# Patient Record
Sex: Female | Born: 1960 | Race: White | Hispanic: No | Marital: Married | Smoking: Never smoker
Health system: Southern US, Community
[De-identification: ages and names within clinical notes are randomized; demographics above are authoritative.]

## PROBLEM LIST (undated history)

## (undated) DIAGNOSIS — E785 Hyperlipidemia, unspecified: Secondary | ICD-10-CM

## (undated) DIAGNOSIS — B009 Herpesviral infection, unspecified: Secondary | ICD-10-CM

## (undated) HISTORY — PX: RHINOPLASTY: SUR1284

## (undated) HISTORY — PX: BREAST BIOPSY: SHX20

## (undated) HISTORY — DX: Hyperlipidemia, unspecified: E78.5

## (undated) HISTORY — DX: Herpesviral infection, unspecified: B00.9

---

## 1997-10-06 ENCOUNTER — Other Ambulatory Visit: Admission: RE | Admit: 1997-10-06 | Discharge: 1997-10-06 | Payer: Self-pay | Admitting: *Deleted

## 1998-10-31 ENCOUNTER — Other Ambulatory Visit: Admission: RE | Admit: 1998-10-31 | Discharge: 1998-10-31 | Payer: Self-pay | Admitting: *Deleted

## 1999-11-02 ENCOUNTER — Other Ambulatory Visit: Admission: RE | Admit: 1999-11-02 | Discharge: 1999-11-02 | Payer: Self-pay | Admitting: *Deleted

## 2000-10-01 ENCOUNTER — Other Ambulatory Visit: Admission: RE | Admit: 2000-10-01 | Discharge: 2000-10-01 | Payer: Self-pay | Admitting: Obstetrics and Gynecology

## 2000-12-15 ENCOUNTER — Inpatient Hospital Stay (HOSPITAL_COMMUNITY): Admission: AD | Admit: 2000-12-15 | Discharge: 2000-12-15 | Payer: Self-pay | Admitting: Obstetrics and Gynecology

## 2000-12-16 ENCOUNTER — Ambulatory Visit: Admission: AD | Admit: 2000-12-16 | Discharge: 2000-12-16 | Payer: Self-pay | Admitting: Obstetrics and Gynecology

## 2000-12-16 ENCOUNTER — Encounter (INDEPENDENT_AMBULATORY_CARE_PROVIDER_SITE_OTHER): Payer: Self-pay | Admitting: Specialist

## 2001-12-10 ENCOUNTER — Other Ambulatory Visit: Admission: RE | Admit: 2001-12-10 | Discharge: 2001-12-10 | Payer: Self-pay | Admitting: *Deleted

## 2001-12-31 ENCOUNTER — Emergency Department (HOSPITAL_COMMUNITY): Admission: EM | Admit: 2001-12-31 | Discharge: 2001-12-31 | Payer: Self-pay | Admitting: Emergency Medicine

## 2002-12-14 ENCOUNTER — Other Ambulatory Visit: Admission: RE | Admit: 2002-12-14 | Discharge: 2002-12-14 | Payer: Self-pay | Admitting: *Deleted

## 2003-01-20 ENCOUNTER — Encounter: Payer: Self-pay | Admitting: *Deleted

## 2003-01-20 ENCOUNTER — Encounter: Admission: RE | Admit: 2003-01-20 | Discharge: 2003-01-20 | Payer: Self-pay | Admitting: *Deleted

## 2003-01-27 ENCOUNTER — Encounter: Admission: RE | Admit: 2003-01-27 | Discharge: 2003-01-27 | Payer: Self-pay | Admitting: *Deleted

## 2003-11-29 ENCOUNTER — Other Ambulatory Visit: Admission: RE | Admit: 2003-11-29 | Discharge: 2003-11-29 | Payer: Self-pay | Admitting: *Deleted

## 2004-02-25 ENCOUNTER — Encounter: Admission: RE | Admit: 2004-02-25 | Discharge: 2004-02-25 | Payer: Self-pay | Admitting: *Deleted

## 2004-11-07 ENCOUNTER — Other Ambulatory Visit: Admission: RE | Admit: 2004-11-07 | Discharge: 2004-11-07 | Payer: Self-pay | Admitting: *Deleted

## 2005-04-03 ENCOUNTER — Encounter: Admission: RE | Admit: 2005-04-03 | Discharge: 2005-04-03 | Payer: Self-pay | Admitting: *Deleted

## 2005-11-08 ENCOUNTER — Other Ambulatory Visit: Admission: RE | Admit: 2005-11-08 | Discharge: 2005-11-08 | Payer: Self-pay | Admitting: *Deleted

## 2006-04-04 ENCOUNTER — Encounter: Admission: RE | Admit: 2006-04-04 | Discharge: 2006-04-04 | Payer: Self-pay | Admitting: *Deleted

## 2006-11-12 ENCOUNTER — Other Ambulatory Visit: Admission: RE | Admit: 2006-11-12 | Discharge: 2006-11-12 | Payer: Self-pay | Admitting: *Deleted

## 2007-04-15 ENCOUNTER — Encounter: Admission: RE | Admit: 2007-04-15 | Discharge: 2007-04-15 | Payer: Self-pay | Admitting: *Deleted

## 2007-11-13 ENCOUNTER — Other Ambulatory Visit: Admission: RE | Admit: 2007-11-13 | Discharge: 2007-11-13 | Payer: Self-pay | Admitting: Gynecology

## 2008-04-15 ENCOUNTER — Encounter: Admission: RE | Admit: 2008-04-15 | Discharge: 2008-04-15 | Payer: Self-pay | Admitting: Gynecology

## 2009-04-25 ENCOUNTER — Encounter: Admission: RE | Admit: 2009-04-25 | Discharge: 2009-04-25 | Payer: Self-pay | Admitting: Gynecology

## 2010-04-22 ENCOUNTER — Encounter: Payer: Self-pay | Admitting: *Deleted

## 2010-04-26 ENCOUNTER — Encounter
Admission: RE | Admit: 2010-04-26 | Discharge: 2010-04-26 | Payer: Self-pay | Source: Home / Self Care | Attending: Gynecology | Admitting: Gynecology

## 2010-08-18 NOTE — Op Note (Signed)
[facility]  Patient:    FARRAN, AMSDEN Visit Number: [id number] MRN: [id number]          Service Type: OBS Location: [facility] Attending Physician:  Osborn Coho Dictated by:   Janeece Riggers Dareen Piano, M.D. Proc. Date: 12/16/00 Admit Date:  12/15/2000                             Operative Report  PREOPERATIVE DIAGNOSES:       1. Intrauterine pregnancy at 18 weeks estimated                                  gestational age.                               2. Spina bifida.  POSTOPERATIVE DIAGNOSES:      1. Intrauterine pregnancy at 18 weeks estimated                                  gestational age.                               2. Spina bifida.  OPERATION:                    Dilation and evacuation.  SURGEON:                      Mark E. Dareen Piano, M.D.  ANESTHESIA:                   General.  ANTIBIOTICS:                  Ancef 1 g.  DRAINS:                       Red rubber catheter to the bladder.  COMPLICATIONS:                None.  SPECIMENS:                    Products of conception, sent to pathology.  DESCRIPTION OF PROCEDURE:     The patient was taken to the operating room, where she was placed in the dorsal supine position.  A general anesthetic was administered without complications.  She was then placed in the dorsal lithotomy position and prepped with Betadine.  She was draped in the usual fashion for this procedure.  A sterile speculum was placed in the vagina.  Two previously-placed laminaria were removed.  The cervical os was grasped with a single-tooth tenaculum.  The cervical os was dilated to a 51 [place].  A 16 mm suction cannula was easily placed into the uterine cavity.  Amniotic fluid was removed.  The long ovoid forceps were then used to remove the fetus and placenta.  Sharp curettage was then performed, followed by repeat suction. The patient was given methergine 0.2 mg IM in the operating room along with Pitocin added  to the IV fluids.  The patient was transferred to the recovery room in stable condition, instrument and lap counts correct x 1.  The patient tolerated the procedure well.  She will be discharged to home with Keflex 500 mg q.i.d. for two days and methergine 0.2 mg p.o. q.6h. x 8.  If Rh negative, she will be given RhoGAM. Dictated by:   Janeece Riggers. Dareen Piano, M.D. Attending Physician:  Osborn Coho DD:  12/16/00 TD:  12/16/00 Job: [phone] [id number]

## 2011-04-04 ENCOUNTER — Other Ambulatory Visit: Payer: Self-pay | Admitting: Gynecology

## 2011-04-04 DIAGNOSIS — Z1231 Encounter for screening mammogram for malignant neoplasm of breast: Secondary | ICD-10-CM

## 2011-05-07 ENCOUNTER — Ambulatory Visit
Admission: RE | Admit: 2011-05-07 | Discharge: 2011-05-07 | Disposition: A | Discharge status: Home / Self Care | Payer: BC Managed Care – PPO | Source: Ambulatory Visit | Attending: Gynecology | Admitting: Gynecology

## 2011-05-07 DIAGNOSIS — Z1231 Encounter for screening mammogram for malignant neoplasm of breast: Secondary | ICD-10-CM

## 2012-04-15 ENCOUNTER — Other Ambulatory Visit: Payer: Self-pay | Admitting: Gynecology

## 2012-04-15 DIAGNOSIS — Z1231 Encounter for screening mammogram for malignant neoplasm of breast: Secondary | ICD-10-CM

## 2012-05-12 ENCOUNTER — Ambulatory Visit: Payer: BC Managed Care – PPO

## 2012-06-09 ENCOUNTER — Ambulatory Visit: Payer: BC Managed Care – PPO

## 2012-07-30 ENCOUNTER — Ambulatory Visit
Admission: RE | Admit: 2012-07-30 | Discharge: 2012-07-30 | Disposition: A | Discharge status: Home / Self Care | Payer: BC Managed Care – PPO | Source: Ambulatory Visit | Attending: Gynecology | Admitting: Gynecology

## 2012-07-30 DIAGNOSIS — Z1231 Encounter for screening mammogram for malignant neoplasm of breast: Secondary | ICD-10-CM

## 2013-02-11 ENCOUNTER — Encounter: Payer: Self-pay | Admitting: Internal Medicine

## 2013-02-11 ENCOUNTER — Ambulatory Visit (INDEPENDENT_AMBULATORY_CARE_PROVIDER_SITE_OTHER): Payer: BC Managed Care – PPO | Admitting: Internal Medicine

## 2013-02-11 VITALS — BP 126/82 | HR 98 | Temp 98.4°F | Resp 16 | Wt 159.0 lb

## 2013-02-11 DIAGNOSIS — Z8041 Family history of malignant neoplasm of ovary: Secondary | ICD-10-CM | POA: Insufficient documentation

## 2013-02-11 DIAGNOSIS — Z803 Family history of malignant neoplasm of breast: Secondary | ICD-10-CM

## 2013-02-11 DIAGNOSIS — Z780 Asymptomatic menopausal state: Secondary | ICD-10-CM | POA: Insufficient documentation

## 2013-02-11 DIAGNOSIS — N951 Menopausal and female climacteric states: Secondary | ICD-10-CM

## 2013-02-11 DIAGNOSIS — N926 Irregular menstruation, unspecified: Secondary | ICD-10-CM

## 2013-02-11 DIAGNOSIS — N920 Excessive and frequent menstruation with regular cycle: Secondary | ICD-10-CM | POA: Insufficient documentation

## 2013-02-11 LAB — LIPID PANEL
Cholesterol: 219 mg/dL — ABNORMAL HIGH (ref 0–200)
HDL: 58 mg/dL (ref >39)
LDL Cholesterol: 135 mg/dL — ABNORMAL HIGH (ref 0–99)
Total CHOL/HDL Ratio: 3.8 Ratio
Triglycerides: 130 mg/dL (ref <150)
VLDL: 26 mg/dL (ref 0–40)

## 2013-02-11 LAB — COMPREHENSIVE METABOLIC PANEL
ALT: 15 U/L (ref 0–35)
AST: 17 U/L (ref 0–37)
Albumin: 4.3 g/dL (ref 3.5–5.2)
Alkaline Phosphatase: 58 U/L (ref 39–117)
BUN: 17 mg/dL (ref 6–23)
CO2: 24 mEq/L (ref 19–32)
Calcium: 9.5 mg/dL (ref 8.4–10.5)
Chloride: 102 mEq/L (ref 96–112)
Creat: 0.74 mg/dL (ref 0.50–1.10)
Glucose, Bld: 80 mg/dL (ref 70–99)
Potassium: 4.7 mEq/L (ref 3.5–5.3)
Sodium: 137 mEq/L (ref 135–145)
Total Bilirubin: 0.3 mg/dL (ref 0.3–1.2)
Total Protein: 7.2 g/dL (ref 6.0–8.3)

## 2013-02-11 LAB — CBC WITH DIFFERENTIAL/PLATELET
Basophils Absolute: 0 10*3/uL (ref 0.0–0.1)
Basophils Relative: 0 % (ref 0–1)
Eosinophils Absolute: 0.1 10*3/uL (ref 0.0–0.7)
Eosinophils Relative: 1 % (ref 0–5)
HCT: 37.2 % (ref 36.0–46.0)
Hemoglobin: 12.8 g/dL (ref 12.0–15.0)
Lymphocytes Relative: 38 % (ref 12–46)
Lymphs Abs: 2.7 10*3/uL (ref 0.7–4.0)
MCH: 30.4 pg (ref 26.0–34.0)
MCHC: 34.4 g/dL (ref 30.0–36.0)
MCV: 88.4 fL (ref 78.0–100.0)
Monocytes Absolute: 0.6 10*3/uL (ref 0.1–1.0)
Monocytes Relative: 8 % (ref 3–12)
Neutro Abs: 3.8 10*3/uL (ref 1.7–7.7)
Neutrophils Relative %: 53 % (ref 43–77)
Platelets: 356 10*3/uL (ref 150–400)
RBC: 4.21 MIL/uL (ref 3.87–5.11)
RDW: 13 % (ref 11.5–15.5)
WBC: 7.2 10*3/uL (ref 4.0–10.5)

## 2013-02-11 LAB — TSH: TSH: 1.278 u[IU]/mL (ref 0.350–4.500)

## 2013-02-11 NOTE — Patient Instructions (Signed)
will refer to gynecology  Dr. Langston Masker  Have labs today

## 2013-02-11 NOTE — Progress Notes (Addendum)
Subjective:    Patient ID: Stacy Mitchell, female    DOB: 1960-06-11, 52 y.o.   MRN: [id number]  HPI  Stacy Mitchell is here as a new pt for first visit PCP care.  She has been using urgent care for primary care.  PMH of remote anemia.  FH of breast cancer in sister and ovarian cancer in mother who died at age 50.    She is concerned about heavy and irregular menses.  She had seen Dr. Chevis Pretty who recommended a saline sonohystogram but pt could not afford this  .  She reports she bled every day for one month mid July to mid August.  She has not had a menses since until this month.    See mm she has extreme breast density done 4/30.  Allergies  Allergen Reactions  . Neomycin    History reviewed. No pertinent past medical history. Past Surgical History  Procedure Laterality Date  . Rhinoplasty     History   Social History  . Marital Status: Married    Spouse Name: N/A    Number of Children: N/A  . Years of Education: N/A   Occupational History  . Not on file.   Social History Main Topics  . Smoking status: Never Smoker   . Smokeless tobacco: Never Used  . Alcohol Use: No  . Drug Use: No  . Sexual Activity: Yes    Birth Control/ Protection: Pill   Other Topics Concern  . Not on file   Social History Narrative  . No narrative on file   Family History  Problem Relation Age of Onset  . Cancer Mother 58    ovarian  . Cancer Father 68    salavary  . Cancer Sister 54    breast (BRACA -)  . Heart disease Maternal Grandmother   . Heart disease Maternal Grandfather   . Heart disease Paternal Grandmother   . Heart disease Paternal Grandfather    Patient Active Problem List   Diagnosis Date Noted  . Menopause 02/11/2013  . Irregular menses 02/11/2013  . Family history of breast cancer in first degree relative 02/11/2013  . Family history of ovarian cancer 02/11/2013   No current outpatient prescriptions on file prior to visit.   No current facility-administered medications  on file prior to visit.      Review of Systems    see HPI Objective:   Physical Exam  Physical Exam  Nursing note and vitals reviewed.  Constitutional: She is oriented to person, place, and time. She appears well-developed and well-nourished.  HENT:  Head: Normocephalic and atraumatic.  Cardiovascular: Normal rate and regular rhythm. Exam reveals no gallop and no friction rub.  No murmur heard.  Pulmonary/Chest: Breath sounds normal. She has no wheezes. She has no rales.  Neurological: She is alert and oriented to person, place, and time.  Skin: Skin is warm and dry.  Psychiatric: She has a normal mood and affect. Her behavior is normal.             Assessment & Plan:  Menorrhagia:  Pt would like second opinion regarding a less expensive U/S option   She may need endometrial biopsy.   Will refer to GYN  Remote anemia will check with CBC today with other labs  FH breast cancer in sister and ovarian cancer in mother.  With extreme breast density will discuss with radiologist  Schedule CPE with me  Addendum 04/05/2012  See scanned consult Dr. Gillian Scarce for  second opinion.  Of 03/11/2013.  Dr. Langston Masker felt pt was perimenopausal and did not necessarily require a sonohysterogram and endometrial biopsy.

## 2013-02-12 LAB — VITAMIN D 25 HYDROXY (VIT D DEFICIENCY, FRACTURES): Vit D, 25-Hydroxy: 41 ng/mL (ref 30–89)

## 2013-02-12 LAB — FOLLICLE STIMULATING HORMONE: FSH: 41.2 m[IU]/mL

## 2013-02-13 ENCOUNTER — Encounter: Payer: Self-pay | Admitting: *Deleted

## 2013-03-03 ENCOUNTER — Telehealth: Payer: Self-pay | Admitting: Internal Medicine

## 2013-03-03 NOTE — Telephone Encounter (Signed)
Left message on home phone to have pt call regarding mammogram result and extreme breast density    Tired work number and was told pt not longer works there

## 2013-06-17 ENCOUNTER — Encounter: Payer: Self-pay | Admitting: Internal Medicine

## 2013-06-17 ENCOUNTER — Ambulatory Visit (INDEPENDENT_AMBULATORY_CARE_PROVIDER_SITE_OTHER): Payer: BC Managed Care – PPO | Admitting: Internal Medicine

## 2013-06-17 VITALS — BP 113/72 | HR 96 | Temp 98.0°F | Resp 18 | Ht 67.5 in | Wt 159.0 lb

## 2013-06-17 DIAGNOSIS — N926 Irregular menstruation, unspecified: Secondary | ICD-10-CM

## 2013-06-17 DIAGNOSIS — E785 Hyperlipidemia, unspecified: Secondary | ICD-10-CM

## 2013-06-17 DIAGNOSIS — Z780 Asymptomatic menopausal state: Secondary | ICD-10-CM

## 2013-06-17 DIAGNOSIS — Z0000 Encounter for general adult medical examination without abnormal findings: Secondary | ICD-10-CM

## 2013-06-17 DIAGNOSIS — N951 Menopausal and female climacteric states: Secondary | ICD-10-CM

## 2013-06-17 DIAGNOSIS — Z23 Encounter for immunization: Secondary | ICD-10-CM

## 2013-06-17 LAB — LIPID PANEL
Cholesterol: 228 mg/dL — ABNORMAL HIGH (ref 0–200)
HDL: 62 mg/dL (ref >39)
LDL Cholesterol: 139 mg/dL — ABNORMAL HIGH (ref 0–99)
Total CHOL/HDL Ratio: 3.7 Ratio
Triglycerides: 133 mg/dL (ref <150)
VLDL: 27 mg/dL (ref 0–40)

## 2013-06-17 LAB — POCT URINALYSIS DIPSTICK
Bilirubin, UA: NEGATIVE
Blood, UA: NEGATIVE
Glucose, UA: NEGATIVE
Ketones, UA: NEGATIVE
Leukocytes, UA: NEGATIVE
Nitrite, UA: NEGATIVE
Protein, UA: NEGATIVE
Spec Grav, UA: 1.02
Urobilinogen, UA: NEGATIVE
pH, UA: 5.5

## 2013-06-17 NOTE — Progress Notes (Signed)
Subjective:    Patient ID: Stacy Mitchell, female    DOB: [birth year], 53 y.o.   MRN: [id number]  HPI  Stacy Mitchell is here for CPE.  Doing well  DUB:  Pam reports she has seen Dr. Lynnette Caffey for a second opinion regarding the need for sonohystogram and endometrial biopsy  ( originally recommended by Dr. Carren Rang)  See scanned note from Dr. Lynnette Caffey.  Pt was felt to be perimenopausal and not postmenopausal as bleeding had not been absent for even 4-5 months.   She is still on norethindrone for birth control and does not wish o come off.    Since then she has been having skipped menses and less bleeding each month.     See mm she has extreme breast density  .  Sister dx with breast cancer and reports sister was BRCA negative  Allergies  Allergen Reactions  . Neomycin    History reviewed. No pertinent past medical history. Past Surgical History  Procedure Laterality Date  . Rhinoplasty     History   Social History  . Marital Status: Married    Spouse Name: N/A    Number of Children: N/A  . Years of Education: N/A   Occupational History  . Not on file.   Social History Main Topics  . Smoking status: Never Smoker   . Smokeless tobacco: Never Used  . Alcohol Use: No  . Drug Use: No  . Sexual Activity: Yes    Birth Control/ Protection: Pill   Other Topics Concern  . Not on file   Social History Narrative  . No narrative on file   Family History  Problem Relation Age of Onset  . Cancer Mother 59    ovarian  . Cancer Father 21    salavary  . Cancer Sister 33    breast (BRACA -)  . Heart disease Maternal Grandmother   . Heart disease Maternal Grandfather   . Heart disease Paternal Grandmother   . Heart disease Paternal Grandfather    Patient Active Problem List   Diagnosis Date Noted  . Menopause 02/11/2013  . Irregular menses 02/11/2013  . Family history of breast cancer in first degree relative 02/11/2013  . Family history of ovarian cancer 02/11/2013  . Menorrhagia  02/11/2013   Current Outpatient Prescriptions on File Prior to Visit  Medication Sig Dispense Refill  . MULTIPLE VITAMIN PO Take 1 tablet by mouth daily.      Marland Kitchen PRESCRIPTION MEDICATION 1 tablet daily. OC unknown( progesterone)       No current facility-administered medications on file prior to visit.      Review of Systems    see HPI Objective:   Physical Exam  Physical Exam  Nursing note and vitals reviewed.  Constitutional: She is oriented to person, place, and time. She appears well-developed and well-nourished.  HENT:  Head: Normocephalic and atraumatic.  Right Ear: Tympanic membrane and ear canal normal. No drainage. Tympanic membrane is not injected and not erythematous.  Left Ear: Tympanic membrane and ear canal normal. No drainage. Tympanic membrane is not injected and not erythematous.  Nose: Nose normal. Right sinus exhibits no maxillary sinus tenderness and no frontal sinus tenderness. Left sinus exhibits no maxillary sinus tenderness and no frontal sinus tenderness.  Mouth/Throat: Oropharynx is clear and moist. No oral lesions. No oropharyngeal exudate.  Eyes: Conjunctivae and EOM are normal. Pupils are equal, round, and reactive to light.  Neck: Normal range of motion. Neck supple. No JVD present.  Carotid bruit is not present. No mass and no thyromegaly present.  Cardiovascular: Normal rate, regular rhythm, S1 normal, S2 normal and intact distal pulses. Exam reveals no gallop and no friction rub.  No murmur heard.  Pulses:  Carotid pulses are 2+ on the right side, and 2+ on the left side.  Dorsalis pedis pulses are 2+ on the right side, and 2+ on the left side.  No carotid bruit. No LE edema  Pulmonary/Chest: Breath sounds normal. She has no wheezes. She has no rales. She exhibits no tenderness.  Breasts no discrete mass, no nipple discharge , no axilllary adenopathy bilaterally. Abdominal: Soft. Bowel sounds are normal. She exhibits no distension and no mass. There  is no hepatosplenomegaly. There is no tenderness. There is no CVA tenderness.  Rectal no mass guaiac neg Musculoskeletal: Normal range of motion.  No active synovitis to joints.  Lymphadenopathy:  She has no cervical adenopathy.  She has no axillary adenopathy.  Right: No inguinal and no supraclavicular adenopathy present.  Left: No inguinal and no supraclavicular adenopathy present.  Neurological: She is alert and oriented to person, place, and time. She has normal strength and normal reflexes. She displays no tremor. No cranial nerve deficit or sensory deficit. Coordination and gait normal.  Skin: Skin is warm and dry. No rash noted. No cyanosis. Nails show no clubbing.  Psychiatric: She has a normal mood and affect. Her speech is normal and behavior is normal. Cognition and memory are normal.          Assessment & Plan:  Health Maintenance:  Will give Tdap today  Pt would like to schedule her colonoscopy in the summer.  Advised to call here for referral.  Last pap 10/2012  Dr. Carren Rang.  Advised to schedule  3D mm after 4/30  Irregular menses  Pt wishes to have pap and Gyn exam with Dr. Lynnette Caffey.  She is due for pap in 10/2013  Extreme breast density  See above  Hyperlipidemia  Will check today   FH breast CA  Sister bRCA neg.   See me as needed

## 2013-06-17 NOTE — Patient Instructions (Signed)
Give pt phone number to [facility]  Dr.  Russella DarStark  Dr.  Marina GoodellPerry or Dr Juanda ChanceBrodie   To lab today  See GYN one more visit and have notes sent to me  Activate my chart

## 2013-06-18 ENCOUNTER — Encounter: Payer: Self-pay | Admitting: *Deleted

## 2013-06-29 ENCOUNTER — Encounter: Payer: Self-pay | Admitting: *Deleted

## 2013-07-17 ENCOUNTER — Other Ambulatory Visit: Payer: Self-pay

## 2013-07-17 DIAGNOSIS — Z1231 Encounter for screening mammogram for malignant neoplasm of breast: Secondary | ICD-10-CM

## 2013-08-03 ENCOUNTER — Encounter (INDEPENDENT_AMBULATORY_CARE_PROVIDER_SITE_OTHER): Payer: Self-pay

## 2013-08-03 ENCOUNTER — Ambulatory Visit
Admission: RE | Admit: 2013-08-03 | Discharge: 2013-08-03 | Disposition: A | Discharge status: Home / Self Care | Payer: BC Managed Care – PPO | Source: Ambulatory Visit

## 2013-08-03 DIAGNOSIS — Z1231 Encounter for screening mammogram for malignant neoplasm of breast: Secondary | ICD-10-CM

## 2014-02-01 ENCOUNTER — Encounter: Payer: Self-pay | Admitting: Internal Medicine

## 2014-06-23 ENCOUNTER — Encounter: Payer: Self-pay | Admitting: Internal Medicine

## 2014-06-23 ENCOUNTER — Ambulatory Visit (INDEPENDENT_AMBULATORY_CARE_PROVIDER_SITE_OTHER): Payer: BLUE CROSS/BLUE SHIELD | Admitting: Internal Medicine

## 2014-06-23 ENCOUNTER — Ambulatory Visit: Payer: Self-pay | Admitting: Internal Medicine

## 2014-06-23 VITALS — BP 120/76 | HR 98 | Resp 16 | Ht 68.0 in | Wt 160.0 lb

## 2014-06-23 DIAGNOSIS — N898 Other specified noninflammatory disorders of vagina: Secondary | ICD-10-CM

## 2014-06-23 DIAGNOSIS — A6000 Herpesviral infection of urogenital system, unspecified: Secondary | ICD-10-CM

## 2014-06-23 MED ORDER — VALACYCLOVIR HCL 1 G PO TABS: 1000.0000 mg | ORAL_TABLET | Freq: Two times a day (BID) | ORAL | Status: AC

## 2014-06-23 NOTE — Progress Notes (Signed)
Subjective:    Patient ID: Stacy Mitchell, female    DOB: 07-06-1960, 54 y.o.   MRN: [id number]  HPI  06/17/2013 note Assessment & Plan:  Health Maintenance: Will give Tdap today Pt would like to schedule her colonoscopy in the summer. Advised to call here for referral. Last pap 10/2012 Dr. Carren Rang. Advised to schedule 3D mm after 4/30  Irregular menses Pt wishes to have pap and Gyn exam with Dr. Lynnette Caffey. She is due for pap in 10/2013  Extreme breast density See above  Hyperlipidemia Will check today   FH breast CA Sister bRCA neg.   See me as needed         TODAY  Stacy Mitchell is here for acute visit  Notes "soreness " in vaginal area.  Was diagnosed with genital herpes in 1991.  Very infrequent exacerbations.  Last outbreak 5 years ago   No fever no other rashes Had pap per GYN with Dr. Lynnette Caffey.  Was on OC to control irregular menses until 03/2014  D/C's back in December  Allergies  Allergen Reactions  . Neomycin    No past medical history on file. Past Surgical History  Procedure Laterality Date  . Rhinoplasty     History   Social History  . Marital Status: Married    Spouse Name: N/A  . Number of Children: N/A  . Years of Education: N/A   Occupational History  . Not on file.   Social History Main Topics  . Smoking status: Never Smoker   . Smokeless tobacco: Never Used  . Alcohol Use: No  . Drug Use: No  . Sexual Activity: Yes    Birth Control/ Protection: Pill   Other Topics Concern  . Not on file   Social History Narrative   Family History  Problem Relation Age of Onset  . Cancer Mother 38    ovarian  . Cancer Father 27    salavary  . Cancer Sister 91    breast (BRACA -)  . Heart disease Maternal Grandmother   . Heart disease Maternal Grandfather   . Heart disease Paternal Grandmother   . Heart disease Paternal Grandfather    Patient Active Problem List   Diagnosis Date Noted  . Hyperlipidemia 06/17/2013  . Menopause 02/11/2013  .  Irregular menses 02/11/2013  . Family history of breast cancer in first degree relative 02/11/2013  . Family history of ovarian cancer 02/11/2013  . Menorrhagia 02/11/2013   Current Outpatient Prescriptions on File Prior to Visit  Medication Sig Dispense Refill  . MULTIPLE VITAMIN PO Take 1 tablet by mouth daily.    . norethindrone (MICRONOR,CAMILA,ERRIN) 0.35 MG tablet Take 1 tablet by mouth daily.    Marland Kitchen PRESCRIPTION MEDICATION 1 tablet daily. OC unknown( progesterone)     No current facility-administered medications on file prior to visit.      Review of Systems    see HPI Objective:   Physical Exam Physical Exam  Nursing note and vitals reviewed.  Constitutional: She is oriented to person, place, and time. She appears well-developed and well-nourished.  HENT:  Head: Normocephalic and atraumatic.  Cardiovascular: Normal rate and regular rhythm. Exam reveals no gallop and no friction rub.  No murmur heard.  Pulmonary/Chest: Breath sounds normal. She has no wheezes. She has no rales.  GYN:  External genitalia    2 small ulcerations at lower labia minora near vaginal vault Neurological: She is alert and oriented to person, place, and time.  Skin: Skin is  warm and dry.  Psychiatric: She has a normal mood and affect. Her behavior is normal.         Assessment & Plan:  Genital herpes  Clinically consistant with HSV exacerbation  Pt requests no lab testing as she has a very high deductible   Will treat with 10 day course of Valcyclovir.   ADvised no intercourse until complete healing

## 2014-08-03 ENCOUNTER — Other Ambulatory Visit: Payer: Self-pay

## 2014-08-03 ENCOUNTER — Other Ambulatory Visit (HOSPITAL_BASED_OUTPATIENT_CLINIC_OR_DEPARTMENT_OTHER): Payer: Self-pay | Admitting: Gynecology

## 2014-08-03 DIAGNOSIS — Z1231 Encounter for screening mammogram for malignant neoplasm of breast: Secondary | ICD-10-CM

## 2014-08-16 ENCOUNTER — Ambulatory Visit (HOSPITAL_BASED_OUTPATIENT_CLINIC_OR_DEPARTMENT_OTHER): Payer: BLUE CROSS/BLUE SHIELD

## 2014-08-18 ENCOUNTER — Ambulatory Visit
Admission: RE | Admit: 2014-08-18 | Discharge: 2014-08-18 | Disposition: A | Discharge status: Home / Self Care | Payer: BLUE CROSS/BLUE SHIELD | Source: Ambulatory Visit

## 2014-08-18 DIAGNOSIS — Z1231 Encounter for screening mammogram for malignant neoplasm of breast: Secondary | ICD-10-CM

## 2014-08-19 ENCOUNTER — Other Ambulatory Visit: Payer: Self-pay | Admitting: Gynecology

## 2014-08-19 DIAGNOSIS — R928 Other abnormal and inconclusive findings on diagnostic imaging of breast: Secondary | ICD-10-CM

## 2014-08-25 ENCOUNTER — Other Ambulatory Visit: Payer: Self-pay | Admitting: Gynecology

## 2014-08-25 ENCOUNTER — Ambulatory Visit
Admission: RE | Admit: 2014-08-25 | Discharge: 2014-08-25 | Disposition: A | Discharge status: Home / Self Care | Payer: BLUE CROSS/BLUE SHIELD | Source: Ambulatory Visit | Attending: Gynecology | Admitting: Gynecology

## 2014-08-25 DIAGNOSIS — R928 Other abnormal and inconclusive findings on diagnostic imaging of breast: Secondary | ICD-10-CM

## 2014-08-25 DIAGNOSIS — R921 Mammographic calcification found on diagnostic imaging of breast: Secondary | ICD-10-CM

## 2014-09-08 ENCOUNTER — Ambulatory Visit
Admission: RE | Admit: 2014-09-08 | Discharge: 2014-09-08 | Disposition: A | Discharge status: Home / Self Care | Payer: BLUE CROSS/BLUE SHIELD | Source: Ambulatory Visit | Attending: Gynecology | Admitting: Gynecology

## 2014-09-08 DIAGNOSIS — R928 Other abnormal and inconclusive findings on diagnostic imaging of breast: Secondary | ICD-10-CM

## 2014-09-08 DIAGNOSIS — R921 Mammographic calcification found on diagnostic imaging of breast: Secondary | ICD-10-CM

## 2014-12-13 ENCOUNTER — Other Ambulatory Visit: Payer: Self-pay

## 2014-12-14 LAB — CYTOLOGY - PAP

## 2015-09-05 ENCOUNTER — Other Ambulatory Visit: Payer: Self-pay | Admitting: Obstetrics & Gynecology

## 2015-09-05 DIAGNOSIS — Z1231 Encounter for screening mammogram for malignant neoplasm of breast: Secondary | ICD-10-CM

## 2015-09-15 ENCOUNTER — Ambulatory Visit
Admission: RE | Admit: 2015-09-15 | Discharge: 2015-09-15 | Disposition: A | Discharge status: Home / Self Care | Payer: BLUE CROSS/BLUE SHIELD | Source: Ambulatory Visit | Attending: Obstetrics & Gynecology | Admitting: Obstetrics & Gynecology

## 2015-09-15 DIAGNOSIS — Z1231 Encounter for screening mammogram for malignant neoplasm of breast: Secondary | ICD-10-CM

## 2016-08-23 ENCOUNTER — Other Ambulatory Visit: Payer: Self-pay | Admitting: Obstetrics & Gynecology

## 2016-08-23 DIAGNOSIS — Z1231 Encounter for screening mammogram for malignant neoplasm of breast: Secondary | ICD-10-CM

## 2016-09-20 ENCOUNTER — Ambulatory Visit
Admission: RE | Admit: 2016-09-20 | Discharge: 2016-09-20 | Disposition: A | Discharge status: Home / Self Care | Payer: Commercial Managed Care - PPO | Source: Ambulatory Visit | Attending: Obstetrics & Gynecology | Admitting: Obstetrics & Gynecology

## 2016-09-20 DIAGNOSIS — Z1231 Encounter for screening mammogram for malignant neoplasm of breast: Secondary | ICD-10-CM

## 2017-08-19 ENCOUNTER — Encounter: Payer: Commercial Managed Care - PPO | Attending: Internal Medicine | Admitting: Skilled Nursing Facility1

## 2017-08-19 ENCOUNTER — Encounter: Payer: Self-pay | Admitting: Skilled Nursing Facility1

## 2017-08-19 DIAGNOSIS — E7800 Pure hypercholesterolemia, unspecified: Secondary | ICD-10-CM | POA: Insufficient documentation

## 2017-08-19 DIAGNOSIS — Z713 Dietary counseling and surveillance: Secondary | ICD-10-CM | POA: Diagnosis not present

## 2017-08-19 NOTE — Progress Notes (Signed)
  Assessment:  Primary concerns today: hypercholesterolemia.  Pt states she was very focused on losing weight. Pt came in with very specific questions: Dietitian answered all questions.  Pt states she feels her cholesterol was high due to a very hectic work schedule for 3 months including fast food every day and less workouts. Pt states she really wants to control her cholesterol with lifestyle not medication. Pt states she will be off of work soon and be able to dedicate all of her time to a healthy self.    MEDICATIONS: See List   DIETARY INTAKE:  Usual eating pattern includes 3 meals and 2 snacks per day.  Everyday foods include none stated.  Avoided foods include egg.    24-hr recall:  B ( AM): cereal with fruit and whole milk or oatmeal with walnuts and raisins or [place] protein bar Snk ( AM): cheese and nut mix L ( PM): canned soup or grilled chicken Snk ( PM):  D ( PM): fast food Snk ( PM): diet dessert Beverages: some wine sometimes, water, diet soda, suagr free red bull  Usual physical activity: Gym 4 days a week  Estimated energy needs: 1600 calories 180g carbohydrates 120 g protein 44 g fat  Progress Towards Goal(s):  In progress.    Intervention:  Nutrition counseling for hypercholesterolemia. Goals: -Aim for 64 ounces of water -2 days a week beef/pork, 2 days a week chicken/turkey, 2 days a week seafood, 1 day a week vegetarian  -Do not eat fast food more than 1 day a week -Aim for 9 grams or less of sugar and fat on nutrition facts label  -Eat non starchy vegetables with every lunch and dinner every day Teaching Method Utilized:  Visual Auditory Hands on  Handouts given during visit include:  Meal ideas  Barriers to learning/adherence to lifestyle change: none stated  Demonstrated degree of understanding via:  Teach Back   Monitoring/Evaluation:  Dietary intake, exercise,and body weight prn.

## 2017-08-20 ENCOUNTER — Other Ambulatory Visit: Payer: Self-pay | Admitting: Obstetrics & Gynecology

## 2017-08-20 DIAGNOSIS — Z1231 Encounter for screening mammogram for malignant neoplasm of breast: Secondary | ICD-10-CM

## 2017-09-23 ENCOUNTER — Ambulatory Visit
Admission: RE | Admit: 2017-09-23 | Discharge: 2017-09-23 | Disposition: A | Discharge status: Home / Self Care | Payer: Commercial Managed Care - PPO | Source: Ambulatory Visit | Attending: Obstetrics & Gynecology | Admitting: Obstetrics & Gynecology

## 2017-09-23 DIAGNOSIS — Z1231 Encounter for screening mammogram for malignant neoplasm of breast: Secondary | ICD-10-CM

## 2017-09-24 ENCOUNTER — Other Ambulatory Visit: Payer: Self-pay | Admitting: Internal Medicine

## 2017-09-24 ENCOUNTER — Ambulatory Visit
Admission: RE | Admit: 2017-09-24 | Discharge: 2017-09-24 | Disposition: A | Discharge status: Home / Self Care | Payer: Commercial Managed Care - PPO | Source: Ambulatory Visit | Attending: Internal Medicine | Admitting: Internal Medicine

## 2017-09-24 DIAGNOSIS — M25552 Pain in left hip: Secondary | ICD-10-CM

## 2017-10-07 ENCOUNTER — Other Ambulatory Visit: Payer: Self-pay | Admitting: Internal Medicine

## 2017-10-07 ENCOUNTER — Ambulatory Visit
Admission: RE | Admit: 2017-10-07 | Discharge: 2017-10-07 | Disposition: A | Discharge status: Home / Self Care | Payer: Commercial Managed Care - PPO | Source: Ambulatory Visit | Attending: Internal Medicine | Admitting: Internal Medicine

## 2017-10-07 DIAGNOSIS — M545 Low back pain, unspecified: Secondary | ICD-10-CM

## 2018-09-08 ENCOUNTER — Other Ambulatory Visit: Payer: Self-pay | Admitting: Obstetrics & Gynecology

## 2018-09-08 DIAGNOSIS — Z1231 Encounter for screening mammogram for malignant neoplasm of breast: Secondary | ICD-10-CM

## 2018-10-23 ENCOUNTER — Other Ambulatory Visit: Payer: Self-pay

## 2018-10-23 ENCOUNTER — Ambulatory Visit
Admission: RE | Admit: 2018-10-23 | Discharge: 2018-10-23 | Disposition: A | Discharge status: Home / Self Care | Payer: Commercial Managed Care - PPO | Source: Ambulatory Visit | Attending: Obstetrics & Gynecology | Admitting: Obstetrics & Gynecology

## 2018-10-23 DIAGNOSIS — Z1231 Encounter for screening mammogram for malignant neoplasm of breast: Secondary | ICD-10-CM

## 2019-10-02 ENCOUNTER — Other Ambulatory Visit: Payer: Self-pay | Admitting: Obstetrics & Gynecology

## 2019-10-02 DIAGNOSIS — Z1231 Encounter for screening mammogram for malignant neoplasm of breast: Secondary | ICD-10-CM

## 2019-10-27 ENCOUNTER — Ambulatory Visit
Admission: RE | Admit: 2019-10-27 | Discharge: 2019-10-27 | Disposition: A | Discharge status: Home / Self Care | Payer: Commercial Managed Care - PPO | Source: Ambulatory Visit | Attending: Obstetrics & Gynecology | Admitting: Obstetrics & Gynecology

## 2019-10-27 ENCOUNTER — Other Ambulatory Visit: Payer: Self-pay

## 2019-10-27 DIAGNOSIS — Z1231 Encounter for screening mammogram for malignant neoplasm of breast: Secondary | ICD-10-CM

## 2022-06-04 IMAGING — MG DIGITAL SCREENING BILAT W/ TOMO W/ CAD
8 series · 8 of 24 positions shown · non-contrast
Comparison: Previous exam(s).

CLINICAL DATA: Screening.

EXAM:
DIGITAL SCREENING BILATERAL MAMMOGRAM WITH TOMO AND CAD

[R MLO synth-2D]
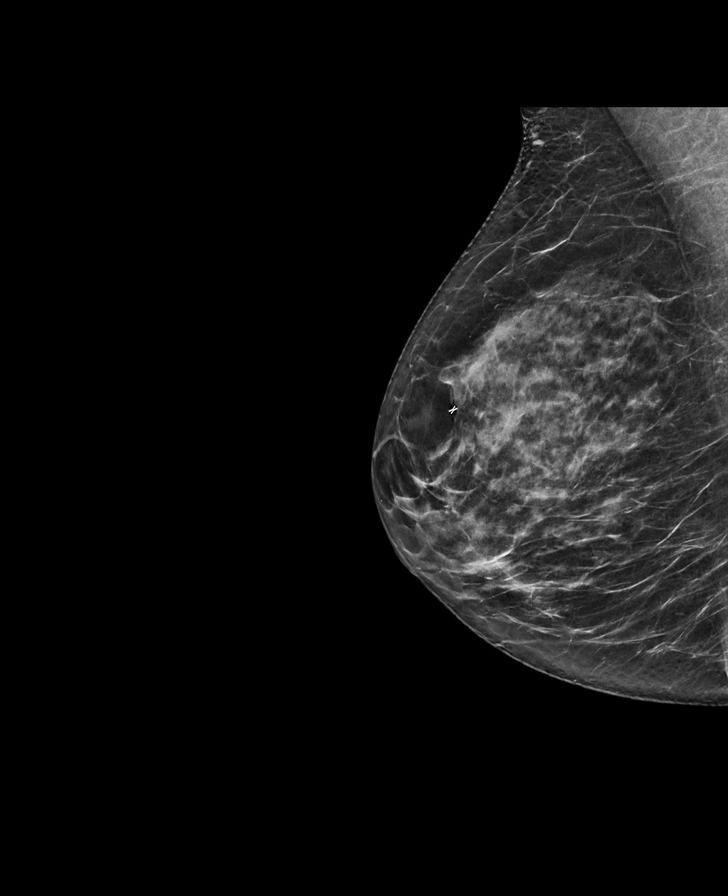

[L CC synth-2D]
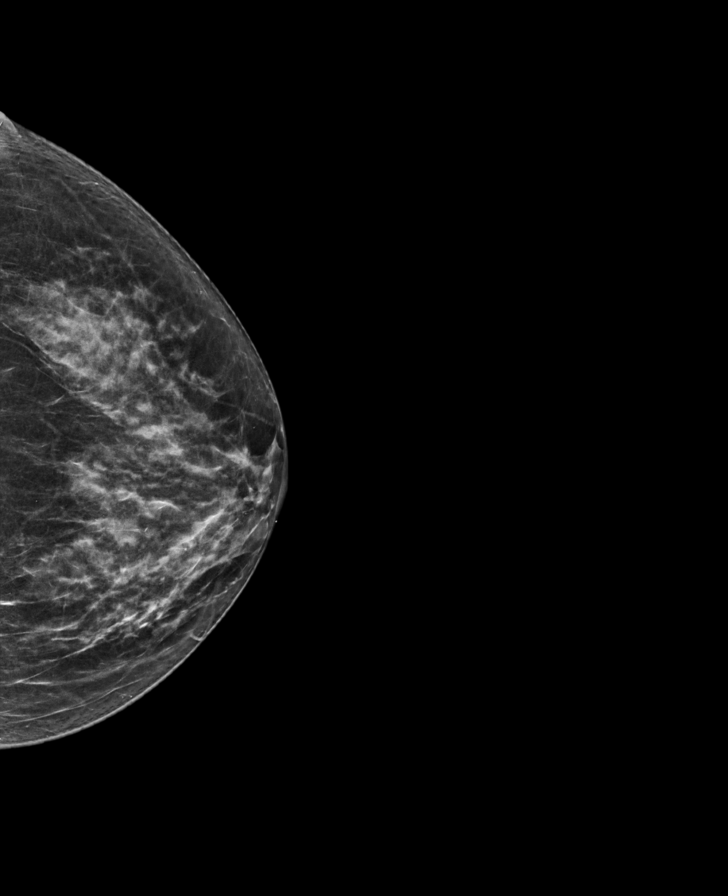

[R CC synth-2D]
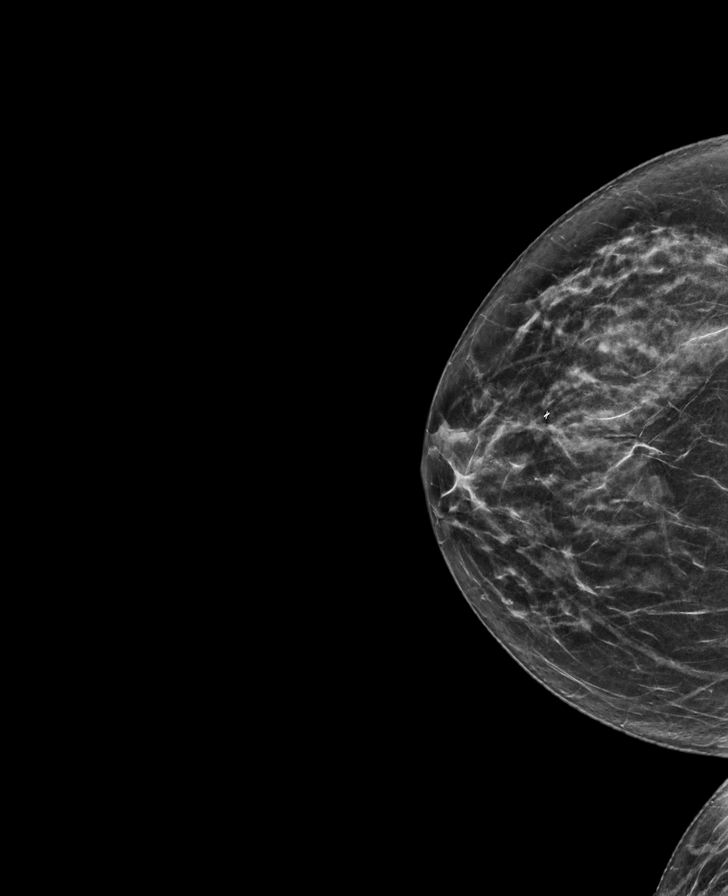

[L MLO synth-2D]
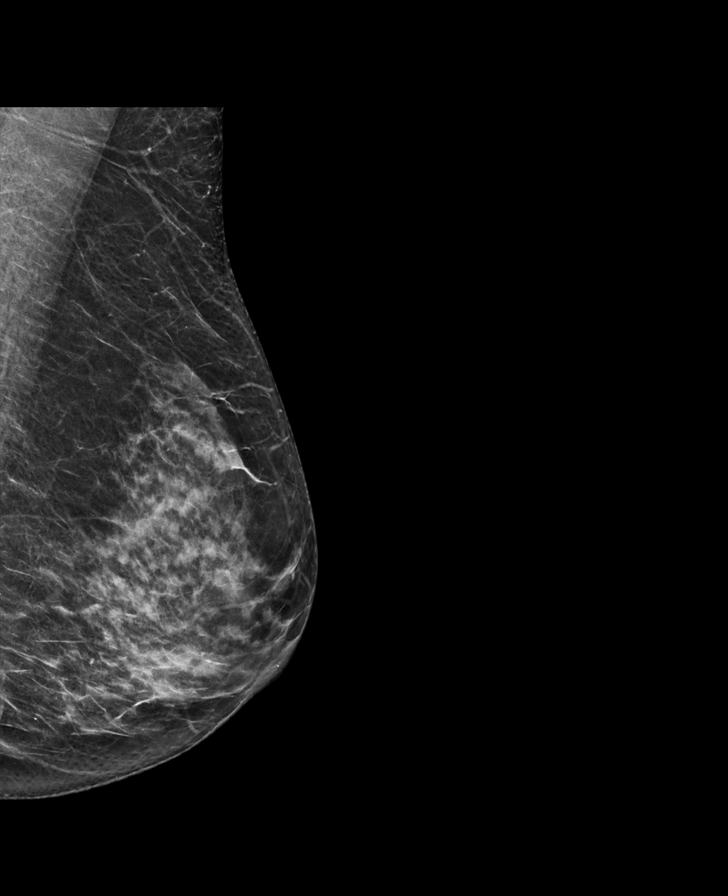

[L CC tomo · tomo slice 30/59.0]
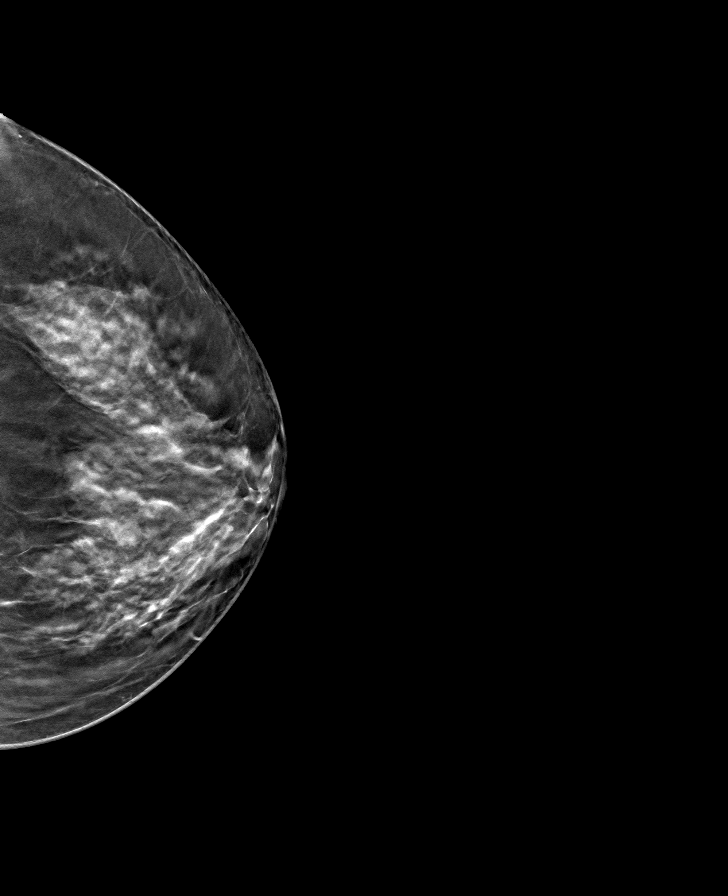

[R MLO tomo · tomo slice 33/64.0]
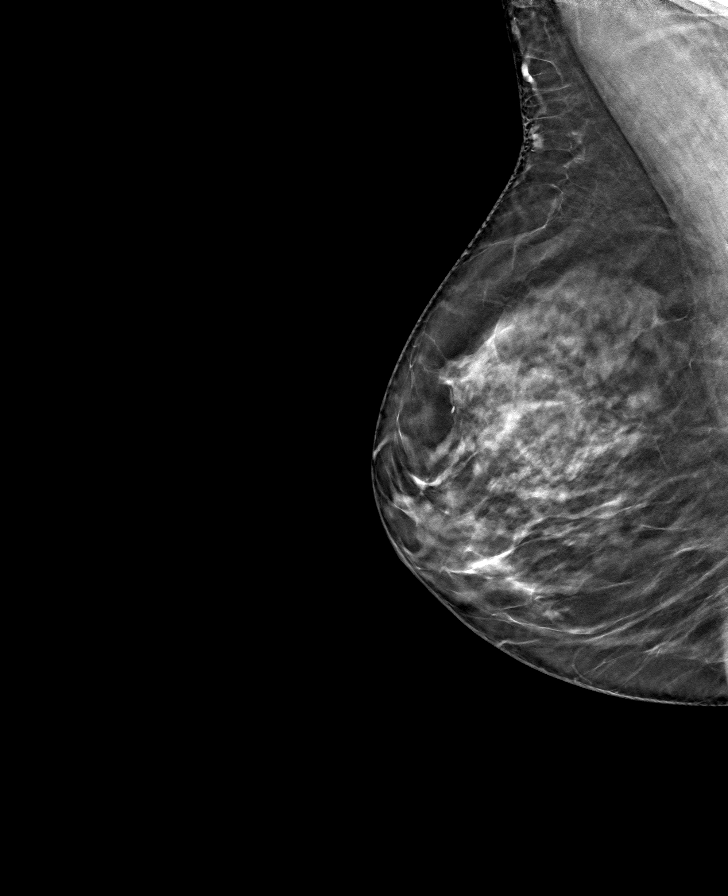

[R CC tomo · tomo slice 29/58.0]
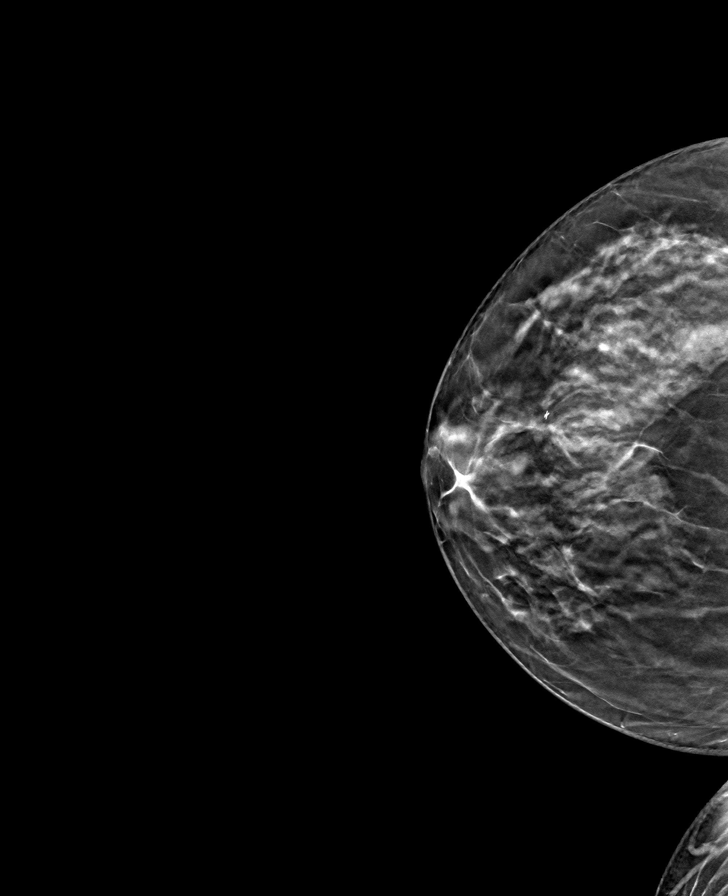

[L MLO tomo · tomo slice 31/62.0]
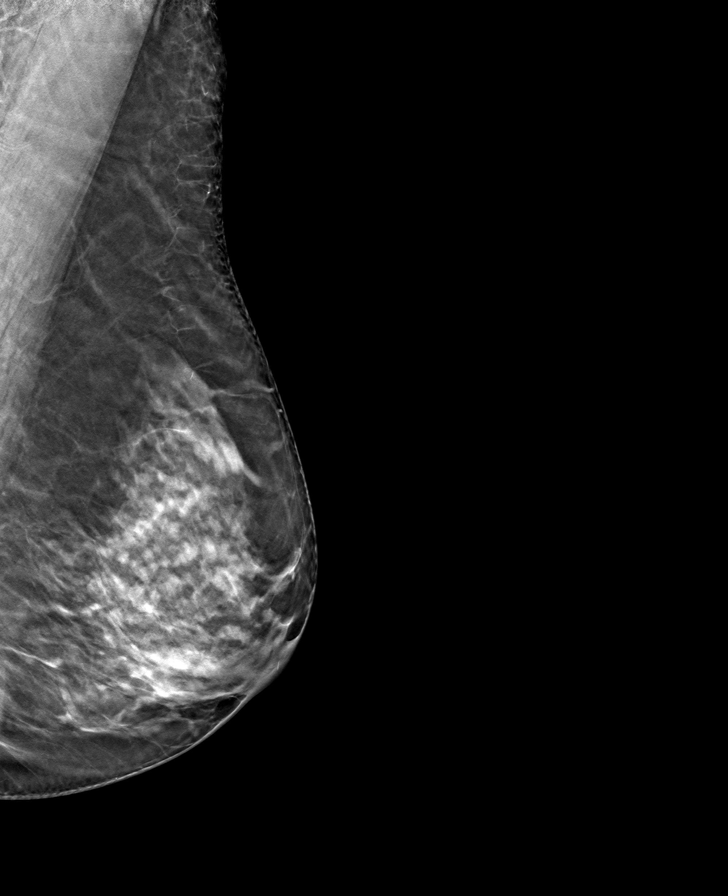

[8 of 24 positions shown; findings below may reference images not displayed]

ACR Breast Density Category c: The breast tissue is heterogeneously
dense, which may obscure small masses.
FINDINGS: There are no findings suspicious for malignancy. Images were
processed with CAD.
IMPRESSION: No mammographic evidence of malignancy. A result letter of this
screening mammogram will be mailed directly to the patient.

RECOMMENDATION:
Screening mammogram in one year. (Code:FT-U-LHB)

BI-RADS CATEGORY  1: Negative.
